# Patient Record
Sex: Male | Born: 1985 | Race: Black or African American | Hispanic: No | Marital: Single | Smoking: Current every day smoker
Health system: Southern US, Community
[De-identification: ages and names within clinical notes are randomized; demographics above are authoritative.]

## PROBLEM LIST (undated history)

## (undated) DIAGNOSIS — N200 Calculus of kidney: Secondary | ICD-10-CM

---

## 2005-08-21 ENCOUNTER — Emergency Department: Payer: Self-pay | Admitting: Unknown Physician Specialty

## 2007-08-20 ENCOUNTER — Emergency Department: Payer: Self-pay | Admitting: Emergency Medicine

## 2010-07-29 ENCOUNTER — Emergency Department: Payer: Self-pay | Admitting: Emergency Medicine

## 2014-11-10 ENCOUNTER — Emergency Department: Payer: Self-pay | Admitting: Emergency Medicine

## 2014-11-10 LAB — URINALYSIS, COMPLETE
BACTERIA: NONE SEEN
Bilirubin,UR: NEGATIVE
Glucose,UR: NEGATIVE mg/dL (ref 0–75)
Ketone: NEGATIVE
Nitrite: NEGATIVE
PH: 5 (ref 4.5–8.0)
Protein: 30
Specific Gravity: 1.021 (ref 1.003–1.030)
Squamous Epithelial: NONE SEEN
WBC UR: 41 /HPF (ref 0–5)

## 2014-11-10 LAB — CBC WITH DIFFERENTIAL/PLATELET
BASOS PCT: 1.2 %
Basophil #: 0.1 10*3/uL (ref 0.0–0.1)
Eosinophil #: 0.2 10*3/uL (ref 0.0–0.7)
Eosinophil %: 1.8 %
HCT: 46.1 % (ref 40.0–52.0)
HGB: 14.8 g/dL (ref 13.0–18.0)
LYMPHS PCT: 47.7 %
Lymphocyte #: 5.1 10*3/uL — ABNORMAL HIGH (ref 1.0–3.6)
MCH: 30.7 pg (ref 26.0–34.0)
MCHC: 32.1 g/dL (ref 32.0–36.0)
MCV: 96 fL (ref 80–100)
Monocyte #: 1.1 x10 3/mm — ABNORMAL HIGH (ref 0.2–1.0)
Monocyte %: 10.6 %
Neutrophil #: 4.1 10*3/uL (ref 1.4–6.5)
Neutrophil %: 38.7 %
PLATELETS: 257 10*3/uL (ref 150–440)
RBC: 4.81 10*6/uL (ref 4.40–5.90)
RDW: 14.6 % — AB (ref 11.5–14.5)
WBC: 10.6 10*3/uL (ref 3.8–10.6)

## 2014-11-10 LAB — COMPREHENSIVE METABOLIC PANEL
ALBUMIN: 4.3 g/dL (ref 3.4–5.0)
ALK PHOS: 86 U/L
ALT: 24 U/L
Anion Gap: 4 — ABNORMAL LOW (ref 7–16)
BUN: 7 mg/dL (ref 7–18)
Bilirubin,Total: 0.5 mg/dL (ref 0.2–1.0)
CHLORIDE: 107 mmol/L (ref 98–107)
CREATININE: 0.97 mg/dL (ref 0.60–1.30)
Calcium, Total: 9.6 mg/dL (ref 8.5–10.1)
Co2: 26 mmol/L (ref 21–32)
EGFR (Non-African Amer.): >60
Glucose: 83 mg/dL (ref 65–99)
Osmolality: 271 (ref 275–301)
Potassium: 4.6 mmol/L (ref 3.5–5.1)
SGOT(AST): 25 U/L (ref 15–37)
Sodium: 137 mmol/L (ref 136–145)
TOTAL PROTEIN: 8.4 g/dL — AB (ref 6.4–8.2)

## 2014-11-10 LAB — LIPASE, BLOOD: LIPASE: 85 U/L (ref 73–393)

## 2014-11-10 LAB — GC/CHLAMYDIA PROBE AMP

## 2014-11-27 LAB — URINE CULTURE

## 2015-05-05 ENCOUNTER — Emergency Department
Admission: EM | Admit: 2015-05-05 | Discharge: 2015-05-05 | Disposition: A | Discharge status: Home / Self Care | Payer: Self-pay | Attending: Emergency Medicine | Admitting: Emergency Medicine

## 2015-05-05 DIAGNOSIS — K047 Periapical abscess without sinus: Secondary | ICD-10-CM

## 2015-05-05 DIAGNOSIS — K0381 Cracked tooth: Secondary | ICD-10-CM | POA: Insufficient documentation

## 2015-05-05 LAB — CBC WITH DIFFERENTIAL/PLATELET
BASOS PCT: 1 %
Basophils Absolute: 0.1 10*3/uL (ref 0–0.1)
Eosinophils Absolute: 0.1 10*3/uL (ref 0–0.7)
Eosinophils Relative: 1 %
HCT: 44.3 % (ref 40.0–52.0)
Hemoglobin: 14.6 g/dL (ref 13.0–18.0)
Lymphocytes Relative: 32 %
Lymphs Abs: 4.6 10*3/uL — ABNORMAL HIGH (ref 1.0–3.6)
MCH: 30.6 pg (ref 26.0–34.0)
MCHC: 33 g/dL (ref 32.0–36.0)
MCV: 92.6 fL (ref 80.0–100.0)
MONOS PCT: 9 %
Monocytes Absolute: 1.4 10*3/uL — ABNORMAL HIGH (ref 0.2–1.0)
NEUTROS ABS: 8.3 10*3/uL — AB (ref 1.4–6.5)
NEUTROS PCT: 57 %
Platelets: 215 10*3/uL (ref 150–440)
RBC: 4.79 MIL/uL (ref 4.40–5.90)
RDW: 14.3 % (ref 11.5–14.5)
WBC: 14.5 10*3/uL — ABNORMAL HIGH (ref 3.8–10.6)

## 2015-05-05 LAB — COMPREHENSIVE METABOLIC PANEL
ALBUMIN: 4.9 g/dL (ref 3.5–5.0)
ALK PHOS: 80 U/L (ref 38–126)
ALT: 12 U/L — ABNORMAL LOW (ref 17–63)
ANION GAP: 8 (ref 5–15)
AST: 18 U/L (ref 15–41)
BUN: 11 mg/dL (ref 6–20)
CHLORIDE: 107 mmol/L (ref 101–111)
CO2: 25 mmol/L (ref 22–32)
Calcium: 9.6 mg/dL (ref 8.9–10.3)
Creatinine, Ser: 0.99 mg/dL (ref 0.61–1.24)
GFR calc non Af Amer: >60 mL/min (ref >60)
Glucose, Bld: 84 mg/dL (ref 65–99)
Potassium: 4 mmol/L (ref 3.5–5.1)
Sodium: 140 mmol/L (ref 135–145)
TOTAL PROTEIN: 8.6 g/dL — AB (ref 6.5–8.1)
Total Bilirubin: 0.5 mg/dL (ref 0.3–1.2)

## 2015-05-05 MED ORDER — SODIUM CHLORIDE 0.9 % IV SOLN
1.5000 g | INTRAVENOUS | Status: AC
  Administered 2015-05-05: 1.5 g via INTRAVENOUS
  Filled 2015-05-05: qty 1.5

## 2015-05-05 MED ORDER — HYDROCODONE-ACETAMINOPHEN 5-325 MG PO TABS: 1.0000 | ORAL_TABLET | ORAL | Status: DC | PRN

## 2015-05-05 MED ORDER — AMOXICILLIN-POT CLAVULANATE 875-125 MG PO TABS: 1.0000 | ORAL_TABLET | Freq: Two times a day (BID) | ORAL | Status: DC

## 2015-05-05 MED ORDER — MORPHINE SULFATE 4 MG/ML IJ SOLN
INTRAMUSCULAR | Status: AC
  Administered 2015-05-05: 4 mg via INTRAVENOUS
  Filled 2015-05-05: qty 1

## 2015-05-05 MED ORDER — MORPHINE SULFATE 4 MG/ML IJ SOLN
4.0000 mg | Freq: Once | INTRAMUSCULAR | Status: AC
  Administered 2015-05-05: 4 mg via INTRAVENOUS

## 2015-05-05 NOTE — ED Notes (Signed)
Pt states awoke this am with right lower jaw swelling. Pt with marked right lower jaw swelling. Pt denies known fever. Denies chills. Pt states took tylenol at home and has had ice on "it all day".

## 2015-05-05 NOTE — Discharge Instructions (Signed)
Dental Abscess °A dental abscess is a collection of infected fluid (pus) from a bacterial infection in the inner part of the tooth (pulp). It usually occurs at the end of the tooth's root.  °CAUSES  °· Severe tooth decay. °· Trauma to the tooth that allows bacteria to enter into the pulp, such as a broken or chipped tooth. °SYMPTOMS  °· Severe pain in and around the infected tooth. °· Swelling and redness around the abscessed tooth or in the mouth or face. °· Tenderness. °· Pus drainage. °· Bad breath. °· Bitter taste in the mouth. °· Difficulty swallowing. °· Difficulty opening the mouth. °· Nausea. °· Vomiting. °· Chills. °· Swollen neck glands. °DIAGNOSIS  °· A medical and dental history will be taken. °· An examination will be performed by tapping on the abscessed tooth. °· X-rays may be taken of the tooth to identify the abscess. °TREATMENT °The goal of treatment is to eliminate the infection. You may be prescribed antibiotic medicine to stop the infection from spreading. A root canal may be performed to save the tooth. If the tooth cannot be saved, it may be pulled (extracted) and the abscess may be drained.  °HOME CARE INSTRUCTIONS °· Only take over-the-counter or prescription medicines for pain, fever, or discomfort as directed by your caregiver. °· Rinse your mouth (gargle) often with salt water (¼ tsp salt in 8 oz [250 ml] of warm water) to relieve pain or swelling. °· Do not drive after taking pain medicine (narcotics). °· Do not apply heat to the outside of your face. °· Return to your dentist for further treatment as directed. °SEEK MEDICAL CARE IF: °· Your pain is not helped by medicine. °· Your pain is getting worse instead of better. °SEEK IMMEDIATE MEDICAL CARE IF: °· You have a fever or persistent symptoms for more than 2-3 days. °· You have a fever and your symptoms suddenly get worse. °· You have chills or a very bad headache. °· You have problems breathing or swallowing. °· You have trouble  opening your mouth. °· You have swelling in the neck or around the eye. °Document Released: 10/21/2005 Document Revised: 07/15/2012 Document Reviewed: 01/29/2011 °ExitCare® Patient Information ©2015 [employer]. This information is not intended to replace advice given to you by your health care provider. Make sure you discuss any questions you have with your health care provider. ° ° °OPTIONS FOR DENTAL FOLLOW UP CARE ° °[place] Department of Health and Human Services - Local Safety Net Dental Clinics °http://www.ncdhhs.gov/dph/oralhealth/services/safetynetclinics.htm °  °[facility] (336-562-3123) ° °[facility] [place] (919-933-9087) ° °[facility] (919-663-1744 ext 237) ° °[facility] (336-570-6415) ° °[facility] (919-968-2025) °This clinic caters to the indigent population and is on a lottery system. °Location: °[facility], [address], [place] °Clinic Hours: °Wednesdays from 6pm - 9pm, patients seen by a lottery system. °For dates, call or go to www.med.[facility].edu/[facility]/patients/Dental-[facility] °Services: °Cleanings, fillings and simple extractions. °Payment Options: °DENTAL WORK IS FREE OF CHARGE. Bring proof of income or support. °Best way to get seen: °Arrive at 5:15 pm - this is a lottery, NOT first come/first serve, so arriving earlier will not increase your chances of being seen. °  °  °[facility] °919-537-3737 °Select option 1 for emergencies °  °Location: °[facility], [address], [place] °Clinic Hours: °No walk-ins accepted - call the day before to schedule an appointment. °Check in times   are 9:30 am and 1:30 pm. °Services: °Simple extractions, temporary fillings, pulpectomy/pulp debridement, uncomplicated abscess drainage. °Payment Options: °PAYMENT IS DUE AT THE TIME OF SERVICE.  Fee is usually $100-200, additional surgical procedures (e.g. abscess drainage) may  be extra. °Cash, checks, Visa/MasterCard accepted.  Can file Medicaid if patient is covered for dental - patient should call case worker to check. °No discount for [facility] patients. °Best way to get seen: °MUST call the day before and get onto the schedule. Can usually be seen the next 1-2 days. No walk-ins accepted. °  °  °[facility] °919-933-9087 °  °Location: °[facility], [address], [place] °Clinic Hours: °M, W, Th, F 8am or 1:30pm, Tues 9a or 1:30 - first come/first served. °Services: °Simple extractions, temporary fillings, uncomplicated abscess drainage.  You do not need to be an [place] resident. °Payment Options: °PAYMENT IS DUE AT THE TIME OF SERVICE. °Dental insurance, otherwise sliding scale - bring proof of income or support. °Depending on income and treatment needed, cost is usually $50-200. °Best way to get seen: °Arrive early as it is first come/first served. °  °  °[facility] °919-542-1641 °  °Location: °[address] °Clinic Hours: °Mon-Thu 8a-5p °Services: °Most basic dental services including extractions and fillings. °Payment Options: °PAYMENT IS DUE AT THE TIME OF SERVICE. °Sliding scale, up to 50% off - bring proof if income or support. °Medicaid with dental option accepted. °Best way to get seen: °Call to schedule an appointment, can usually be seen within 2 weeks OR they will try to see walk-ins - show up at 8a or 2p (you may have to wait). °  °  °[facility] °919-245-2435 °[place] RESIDENTS ONLY °  °Location: °[facility], [address], [place], [place] [address] °Clinic Hours: By appointment only. °Monday - Thursday 8am-5pm, Friday 8am-12pm °Services: Cleanings, fillings, extractions. °Payment Options: °PAYMENT IS DUE AT THE TIME OF SERVICE. °Cash, Visa or MasterCard. Sliding scale - $30 minimum per service. °Best way to get seen: °Come in to  office, complete packet and make an appointment - need proof of income °or support monies for each household member and proof of [place] residence. °Usually takes about a month to get in. °  °  °[facility] °[phone] °  °Location: °[address] [place] °Clinic Hours: Walk-in Urgent Care Dental Services are offered Monday-Friday mornings only. °The numbers of emergencies accepted daily is limited to the number of °providers available. °Maximum 15 - Mondays, Wednesdays & Thursdays °Maximum 10 - Tuesdays & Fridays °Services: °You do not need to be a [place] resident to be seen for a dental emergency. °Emergencies are defined as pain, swelling, abnormal bleeding, or dental trauma. Walkins will receive x-rays if needed. °NOTE: Dental cleaning is not an emergency. °Payment Options: °PAYMENT IS DUE AT THE TIME OF SERVICE. °Minimum co-pay is $40.00 for uninsured patients. °Minimum co-pay is $3.00 for Medicaid with dental coverage. °Dental Insurance is accepted and must be presented at time of visit. °Medicare does not cover dental. °Forms of payment: Cash, credit card, checks. °Best way to get seen: °If not previously registered with the clinic, walk-in dental registration begins at 7:15 am and is on a first come/first serve basis. °If previously registered with the clinic, call to make an appointment. °  °  °[facility] °919-776-4359 °[place] RESIDENTS ONLY °  °Location: °[address],   [place], [place] °Clinic Hours: °Mon-Thu 10a-2p °Services: Extractions only! °Payment Options: °FREE (donations accepted) - bring proof of income or support °Best way to get seen: °Call and schedule an appointment OR come at 8am on the 1st Monday of every month (except for holidays) when it is first come/first served. °  °  °[facility] °[phone] °  °Location: °[address], [place] °Clinic Hours: °Friday mornings °Services, Payment Options, Best way to get  seen: °Call for info °

## 2015-05-05 NOTE — ED Provider Notes (Signed)
[facility] Emergency Department Provider Note ____________________________________________  Time seen: Approximately 10:45 PM  I have reviewed the triage vital signs and the nursing notes.   HISTORY  Chief Complaint Facial Swelling  HPI Willie Morton is a 29 y.o. male who presents to the emergency department for facial swelling and dental pain. He states that the swelling began last night and has worsened throughout the day. He states that the pain has become very intense. He reports that he has had similar symptoms in the same area but did not do anything to treat it. Pain is a 10/10.   No past medical history on file.  There are no active problems to display for this patient.   No past surgical history on file.  Current Outpatient Rx  Name  Route  Sig  Dispense  Refill  . amoxicillin-clavulanate (AUGMENTIN) 875-125 MG per tablet   Oral   Take 1 tablet by mouth 2 (two) times daily.   20 tablet   0   . HYDROcodone-acetaminophen (NORCO/VICODIN) 5-325 MG per tablet   Oral   Take 1 tablet by mouth every 4 (four) hours as needed for moderate pain.   12 tablet   0     Allergies Review of patient's allergies indicates no known allergies.  No family history on file.  Social History History  Substance Use Topics  . Smoking status: Not on file  . Smokeless tobacco: Not on file  . Alcohol Use: Not on file    Review of Systems Constitutional: No fever/chills Eyes: No visual changes. ENT: No sore throat. Cardiovascular: Denies chest pain. Respiratory: Denies shortness of breath. Gastrointestinal: No abdominal pain.  No nausea, no vomiting.  Genitourinary: Negative for dysuria. Musculoskeletal: Negative for back pain. Skin: Negative for rash. Neurological: Negative for headaches, focal weakness or numbness. 10-point ROS otherwise negative.  ____________________________________________   PHYSICAL EXAM:  VITAL SIGNS: ED Triage Vitals    Enc Vitals Group     BP 05/05/15 1949 125/76 mmHg     Pulse Rate 05/05/15 1949 70     Resp 05/05/15 1949 14     Temp 05/05/15 1949 99.3 F (37.4 C)     Temp Source 05/05/15 1949 Oral     SpO2 05/05/15 1949 100 %     Weight 05/05/15 1949 145 lb (65.772 kg)     Height 05/05/15 1949  (1.803 m)     Head Cir --      Peak Flow --      Pain Score 05/05/15 1950 9     Pain Loc --      Pain Edu? --      Excl. in GC? --     Constitutional: Alert and oriented. Well appearing and in no acute distress. Eyes: Conjunctivae are normal. PERRL. EOMI. Head: Atraumatic. Nose: No congestion/rhinnorhea. Mouth/Throat: Mucous membranes are moist.  Oropharynx non-erythematous. No induration in sublingual area. Periodontal Exam    Neck: No stridor. No tenderness. Mandibular edema does not extend into the neck.  Hematological/Lymphatic/Immunilogical: No cervical lymphadenopathy. Cardiovascular:   Good peripheral circulation. Respiratory: Normal respiratory effort.  No retractions. Musculoskeletal: No lower extremity tenderness nor edema.  No joint effusions. Neurologic:  Normal speech and language. No gross focal neurologic deficits are appreciated. Speech is normal. No gait instability. Skin:  Skin is warm, dry and intact. No rash noted. Psychiatric: Mood and affect are normal. Speech and behavior are normal.  ____________________________________________   LABS (all labs ordered are listed, but  only abnormal results are displayed)  Labs Reviewed  CBC WITH DIFFERENTIAL/PLATELET - Abnormal; Notable for the following:    WBC 14.5 (*)    Neutro Abs 8.3 (*)    Lymphs Abs 4.6 (*)    Monocytes Absolute 1.4 (*)    All other components within normal limits  COMPREHENSIVE METABOLIC PANEL - Abnormal; Notable for the following:    Total Protein 8.6 (*)    ALT 12 (*)    All other components within normal limits   ____________________________________________   RADIOLOGY  Not  indicated ____________________________________________   PROCEDURES  Procedure(s) performed: None  Critical Care performed: No  ____________________________________________   INITIAL IMPRESSION / ASSESSMENT AND PLAN / ED COURSE  Pertinent labs & imaging results that were available during my care of the patient were reviewed by me and considered in my medical decision making (see chart for details).  IV Unasyn was given in the ER. Patient will receive a Rx. For Augmentin and Norco.   Patient was advised to see the dentist within 14 days. Also advised to take the antibiotic until finished. Instructed to return to the ER for symptoms that change or worsen if you are unable to schedule an appointment. ____________________________________________   FINAL CLINICAL IMPRESSION(S) / ED DIAGNOSES  Final diagnoses:  Dental abscess      Chinita PesterCari B Xenia Nile, FNP 05/05/15 [id number]  Loleta Roseory Forbach, MD 05/06/15 [id number]

## 2016-10-02 ENCOUNTER — Encounter: Payer: Self-pay | Admitting: *Deleted

## 2016-10-02 ENCOUNTER — Emergency Department
Admission: EM | Admit: 2016-10-02 | Discharge: 2016-10-02 | Disposition: A | Discharge status: Home / Self Care | Payer: Managed Care, Other (non HMO) | Attending: Emergency Medicine | Admitting: Emergency Medicine

## 2016-10-02 DIAGNOSIS — R300 Dysuria: Secondary | ICD-10-CM | POA: Diagnosis present

## 2016-10-02 DIAGNOSIS — A549 Gonococcal infection, unspecified: Secondary | ICD-10-CM | POA: Insufficient documentation

## 2016-10-02 DIAGNOSIS — F17200 Nicotine dependence, unspecified, uncomplicated: Secondary | ICD-10-CM | POA: Insufficient documentation

## 2016-10-02 HISTORY — DX: Calculus of kidney: N20.0

## 2016-10-02 LAB — URINALYSIS COMPLETE WITH MICROSCOPIC (ARMC ONLY)
BACTERIA UA: NONE SEEN
Bilirubin Urine: NEGATIVE
Glucose, UA: NEGATIVE mg/dL
KETONES UR: NEGATIVE mg/dL
NITRITE: NEGATIVE
PH: 6 (ref 5.0–8.0)
PROTEIN: 100 mg/dL — AB
SQUAMOUS EPITHELIAL / LPF: NONE SEEN
Specific Gravity, Urine: 1.027 (ref 1.005–1.030)

## 2016-10-02 LAB — CHLAMYDIA/NGC RT PCR (ARMC ONLY)
Chlamydia Tr: NOT DETECTED
N gonorrhoeae: DETECTED — AB

## 2016-10-02 MED ORDER — CEFTRIAXONE SODIUM 250 MG IJ SOLR
250.0000 mg | Freq: Once | INTRAMUSCULAR | Status: AC
  Administered 2016-10-02: 250 mg via INTRAMUSCULAR
  Filled 2016-10-02: qty 250

## 2016-10-02 MED ORDER — DEXTROSE 5 % IV SOLN: 250.0000 mg | Freq: Once | INTRAVENOUS | Status: DC

## 2016-10-02 MED ORDER — AZITHROMYCIN 500 MG PO TABS
1000.0000 mg | ORAL_TABLET | Freq: Once | ORAL | Status: AC
  Administered 2016-10-02: 1000 mg via ORAL
  Filled 2016-10-02: qty 2

## 2016-10-02 NOTE — ED Notes (Signed)
Pt found walking in hallway, stating he is hungry and needs to go. Pt directed to room. Will talk to Dr. Shaune PollackLord about getting food and DC papers.

## 2016-10-02 NOTE — ED Notes (Signed)
Pt stated he had to leave to go pick up daughter, Dr. Shaune PollackLord gave pt DC papers and discussed them with pt. Dr. Shaune PollackLord states pt verbalized understanding of DC papers. Ambulatory upon DC

## 2016-10-02 NOTE — ED Triage Notes (Signed)
States difficulty urinating with dark urine and some blood, states hx of kidney stones, pt awake and alert in no acute distress

## 2016-10-02 NOTE — ED Provider Notes (Signed)
[facility] Emergency Department Provider Note ____________________________________________   I have reviewed the triage vital signs and the triage nursing note.  HISTORY  Chief Complaint Dysuria   Historian Patient  HPI Willie Morton is a 30 y.o. male presents with several days of pelvic pressure and burning with urination. He states that he's had bloody and dark urine. He reports history of kidney stones, but this does not feel similar to prior kidney stones. Denies fever. He states that he was potentially exposed to STD through sexual contact. No nausea or vomiting. No diarrhea. Discomfort is mild and worse when he tries to urinate.    Past Medical History:  Diagnosis Date  . Kidney stone     There are no active problems to display for this patient.   History reviewed. No pertinent surgical history.  Prior to Admission medications   Medication Sig Start Date End Date Taking? Authorizing Provider  acetaminophen (TYLENOL) 500 MG chewable tablet Chew 500 mg by mouth every 4 (four) hours as needed for pain.   Yes Historical Provider, MD    No Known Allergies  History reviewed. No pertinent family history.  Social History Social History  Substance Use Topics  . Smoking status: Current Every Day Smoker  . Smokeless tobacco: Never Used  . Alcohol use Yes    Review of Systems  Constitutional: Negative for fever. Eyes: Negative for visual changes. ENT: Negative for sore throat. Cardiovascular: Negative for chest pain. Respiratory: Negative for shortness of breath. Gastrointestinal: Negative for Vomiting or diarrhea. Genitourinary: Positive as per history of present illness Musculoskeletal: Negative for back pain. Skin: Negative for rash. Neurological: Negative for headache. 10 point Review of Systems otherwise negative ____________________________________________   PHYSICAL EXAM:  VITAL SIGNS: ED Triage Vitals [10/02/16 0952]  Enc  Vitals Group     BP 135/78     Pulse Rate 99     Resp 18     Temp 99.1 F (37.3 C)     Temp Source Oral     SpO2 99 %     Weight 144 lb (65.3 kg)     Height 5\' 11"  (1.803 m)     Head Circumference      Peak Flow      Pain Score 10     Pain Loc      Pain Edu?      Excl. in GC?      Constitutional: Alert and oriented. Well appearing and in no distress. HEENT   Head: Normocephalic and atraumatic.      Eyes: Conjunctivae are normal. PERRL. Normal extraocular movements.      Ears:         Nose: No congestion/rhinnorhea.   Mouth/Throat: Mucous membranes are moist.   Neck: No stridor. Cardiovascular/Chest: Normal peripheral extremities color Respiratory: Normal respiratory effort without tachypnea nor retractions. Gastrointestinal: Soft. No distention, no guarding, no rebound. Very mild suprapubic discomfort.  No focal rlq pain.  Genitourinary/rectal:Deferred Musculoskeletal: Nontender with normal range of motion in all extremities. No joint effusions.  No lower extremity tenderness.  No edema. Neurologic:  Normal speech and language. No gross or focal neurologic deficits are appreciated. Skin:  Skin is warm, dry and intact. No rash noted. Psychiatric: Mood and affect are normal. Speech and behavior are normal. Patient exhibits appropriate insight and judgment.   ____________________________________________  LABS (pertinent positives/negatives)  Labs Reviewed  CHLAMYDIA/NGC RT PCR ([facility] ONLY) - Abnormal; Notable for the following:  Result Value   N gonorrhoeae DETECTED (*)    All other components within normal limits  URINALYSIS COMPLETEWITH MICROSCOPIC ([facility] ONLY) - Abnormal; Notable for the following:    Color, Urine AMBER (*)    APPearance CLOUDY (*)    Hgb urine dipstick 1+ (*)    Protein, ur 100 (*)    Leukocytes, UA 3+ (*)    All other components within normal limits  URINE CULTURE    ____________________________________________     EKG I, Governor Rooksebecca Hensley Treat, MD, the attending physician have personally viewed and interpreted all ECGs.  None ____________________________________________  RADIOLOGY All Xrays were viewed by me. Imaging interpreted by Radiologist.  None __________________________________________  PROCEDURES  Procedure(s) performed: None  Critical Care performed: None  ____________________________________________   ED COURSE / ASSESSMENT AND PLAN  Pertinent labs & imaging results that were available during my care of the patient were reviewed by me and considered in my medical decision making (see chart for details).   Initially patient's symptoms sounded more potentially concerning for kidney stone with report of hematuria, but then he states it does not feel similar to him, and he stated that he was mostly concerned about possible STD exposure.  Patient was treated with Rocephin 250 mg IM and azithromycin and found to be positive for gonorrhea. We discussed testing or treatment for any sexual partners.  Your culture was sent. Patient declined GU physical exam.   CONSULTATIONS:  None  Patient / Family / Caregiver informed of clinical course, medical decision-making process, and agree with plan.  I discussed return precautions, follow-up instructions, and discharge instructions with patient and/or family.   ___________________________________________   FINAL CLINICAL IMPRESSION(S) / ED DIAGNOSES   Final diagnoses:  Dysuria  Gonorrhea              Note: This dictation was prepared with Dragon dictation. Any transcriptional errors that result from this process are unintentional    Governor Rooksebecca Shontell Prosser, MD 10/02/16 [phone]

## 2016-10-02 NOTE — Discharge Instructions (Signed)
You were evaluated for discomfort with urination, and found to have STD, gonorrhea. You are given treatment here in the emergency department. You do need to tell any sexual contacts so they may be tested and treated as well.  Return to the emergency department for any worsening abdominal pain, fever, skin rash, vomiting, or any other symptoms concerning to you.

## 2016-10-02 NOTE — ED Notes (Signed)
Pt given meal tray and water 

## 2016-10-03 LAB — URINE CULTURE: Culture: NO GROWTH

## 2020-01-30 ENCOUNTER — Emergency Department: Payer: PRIVATE HEALTH INSURANCE

## 2020-01-30 ENCOUNTER — Encounter: Payer: Self-pay | Admitting: Emergency Medicine

## 2020-01-30 ENCOUNTER — Other Ambulatory Visit: Payer: Self-pay

## 2020-01-30 ENCOUNTER — Emergency Department
Admission: EM | Admit: 2020-01-30 | Discharge: 2020-01-30 | Disposition: A | Discharge status: Home / Self Care | Payer: PRIVATE HEALTH INSURANCE | Attending: Emergency Medicine | Admitting: Emergency Medicine

## 2020-01-30 DIAGNOSIS — F1210 Cannabis abuse, uncomplicated: Secondary | ICD-10-CM | POA: Diagnosis not present

## 2020-01-30 DIAGNOSIS — Y92411 Interstate highway as the place of occurrence of the external cause: Secondary | ICD-10-CM | POA: Diagnosis not present

## 2020-01-30 DIAGNOSIS — Y939 Activity, unspecified: Secondary | ICD-10-CM | POA: Insufficient documentation

## 2020-01-30 DIAGNOSIS — M79641 Pain in right hand: Secondary | ICD-10-CM | POA: Insufficient documentation

## 2020-01-30 DIAGNOSIS — F17210 Nicotine dependence, cigarettes, uncomplicated: Secondary | ICD-10-CM | POA: Insufficient documentation

## 2020-01-30 DIAGNOSIS — R0789 Other chest pain: Secondary | ICD-10-CM | POA: Insufficient documentation

## 2020-01-30 DIAGNOSIS — S40812A Abrasion of left upper arm, initial encounter: Secondary | ICD-10-CM | POA: Diagnosis not present

## 2020-01-30 DIAGNOSIS — S59912A Unspecified injury of left forearm, initial encounter: Secondary | ICD-10-CM | POA: Diagnosis present

## 2020-01-30 DIAGNOSIS — Y999 Unspecified external cause status: Secondary | ICD-10-CM | POA: Diagnosis not present

## 2020-01-30 MED ORDER — IBUPROFEN 600 MG PO TABS: 600.0000 mg | ORAL_TABLET | Freq: Four times a day (QID) | ORAL | 0 refills | Status: DC | PRN

## 2020-01-30 MED ORDER — CYCLOBENZAPRINE HCL 5 MG PO TABS: ORAL_TABLET | ORAL | 0 refills | Status: DC
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
## 2020-01-30 NOTE — Discharge Instructions (Signed)
Your x-rays were normal.  Please take ibuprofen for pain and inflammation and Flexeril to help relax your muscles.  Do not drive or work while taking the Flexeril.  Please follow-up with primary care.
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
## 2020-03-16 ENCOUNTER — Ambulatory Visit: Payer: Self-pay | Admitting: Family Medicine

## 2020-03-16 ENCOUNTER — Encounter: Payer: Self-pay | Admitting: Family Medicine

## 2020-03-16 ENCOUNTER — Other Ambulatory Visit: Payer: Self-pay

## 2020-03-16 DIAGNOSIS — Z113 Encounter for screening for infections with a predominantly sexual mode of transmission: Secondary | ICD-10-CM

## 2020-03-16 DIAGNOSIS — Z720 Tobacco use: Secondary | ICD-10-CM

## 2020-03-16 DIAGNOSIS — N5089 Other specified disorders of the male genital organs: Secondary | ICD-10-CM

## 2020-03-16 LAB — GRAM STAIN

## 2020-03-16 NOTE — Progress Notes (Signed)
[facility] Department STI clinic/screening visit  Subjective:  Willie Morton is a 34 y.o. male being seen today for  Chief Complaint  Patient presents with  . SEXUALLY TRANSMITTED DISEASE     The patient reports they do have symptoms.   Patient has the following medical conditions:  There are no problems to display for this patient.   HPI  Pt reports he is here for STI screening. Noticed discharge/wetness before ejaculating x3 months. Also has had lesion on penis come and go over past 8 months - tingly, itchy, not painful.  Pt last urinated 3.5 hrs ago.  See flowsheet for further details and programmatic requirements.    No components found for: HCV  The following portions of the patient's history were reviewed and updated as appropriate: allergies, current medications, past medical history, past social history, past surgical history and problem list.  Objective:  There were no vitals filed for this visit.   Physical Exam Constitutional:      Appearance: Normal appearance.  HENT:     Head: Normocephalic and atraumatic.     Comments: No nits or hair loss    Mouth/Throat:     Mouth: Mucous membranes are moist.     Pharynx: Oropharynx is clear. No oropharyngeal exudate or posterior oropharyngeal erythema.  Pulmonary:     Effort: Pulmonary effort is normal.  Abdominal:     General: Abdomen is flat.     Palpations: Abdomen is soft. There is no hepatomegaly or mass.     Tenderness: There is no abdominal tenderness.  Genitourinary:    Pubic Area: No rash or pubic lice.      Penis: Circumcised. Lesions present. No discharge.      Testes:        Right: Mass (~2cm mobile, mildly tender mass.) and tenderness (mild) present. Swelling not present.        Left: Mass, tenderness or swelling not present.     Epididymis:     Right: Normal. No tenderness.     Left: Normal. No tenderness.     Rectum: Normal.    Lymphadenopathy:     Head:     Right side of head:  No preauricular or posterior auricular adenopathy.     Left side of head: No preauricular or posterior auricular adenopathy.     Cervical: No cervical adenopathy.     Upper Body:     Right upper body: No supraclavicular or axillary adenopathy.     Left upper body: No supraclavicular or axillary adenopathy.     Lower Body: No right inguinal adenopathy. No left inguinal adenopathy.  Skin:    General: Skin is warm and dry.     Findings: No rash.  Neurological:     Mental Status: He is alert and oriented to person, place, and time.       Assessment and Plan:  Willie Morton is a 34 y.o. male presenting to the [facility] Department for STI screening   1. Screening examination for venereal disease -Pt with symptoms. Screenings today as below. Treat gram stain per standing order. -Patient does meet criteria for HepB, HepC Screening. Declines these screenings. -Regarding patch on penis: suspicious for HPV, also recommended screening for syphilis. Advised if area ever blisters or is painful to RTC for HSV screen. -Counseled on warning s/sx and when to seek care. Recommended condom use with all sex and discussed importance of condom use for STI prevention. - Gram stain - Gonococcus  culture x2- throat and urethra - HIV [facility] - Syphilis Serology, [facility]  2. Tobacco use Encouraged pt in their desire to quit, counseled using 5 A's. Tobacco Quitline info given and offered referral for behavioral health which pt declines.  3. 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    Return if symptoms worsen or fail to improve.  No future appointments.  Kandee Keen, PA-C

## 2020-03-16 NOTE — Progress Notes (Signed)
Gram stain reviewed by provider, no tx today. Provider orders completed. 

## 2020-03-21 LAB — GONOCOCCUS CULTURE

## 2021-02-05 ENCOUNTER — Emergency Department
Admission: EM | Admit: 2021-02-05 | Discharge: 2021-02-05 | Disposition: A | Discharge status: Home / Self Care | Payer: PRIVATE HEALTH INSURANCE | Attending: Emergency Medicine | Admitting: Emergency Medicine

## 2021-02-05 ENCOUNTER — Emergency Department: Payer: PRIVATE HEALTH INSURANCE

## 2021-02-05 ENCOUNTER — Other Ambulatory Visit: Payer: Self-pay

## 2021-02-05 ENCOUNTER — Encounter: Payer: Self-pay | Admitting: Emergency Medicine

## 2021-02-05 DIAGNOSIS — M545 Low back pain, unspecified: Secondary | ICD-10-CM | POA: Diagnosis not present

## 2021-02-05 DIAGNOSIS — F17210 Nicotine dependence, cigarettes, uncomplicated: Secondary | ICD-10-CM | POA: Diagnosis not present

## 2021-02-05 LAB — URINALYSIS, COMPLETE (UACMP) WITH MICROSCOPIC
Bacteria, UA: NONE SEEN
Bilirubin Urine: NEGATIVE
Glucose, UA: NEGATIVE mg/dL
Ketones, ur: NEGATIVE mg/dL
Leukocytes,Ua: NEGATIVE
Nitrite: NEGATIVE
Protein, ur: NEGATIVE mg/dL
Specific Gravity, Urine: 1.021 (ref 1.005–1.030)
Squamous Epithelial / LPF: NONE SEEN (ref 0–5)
WBC, UA: NONE SEEN WBC/hpf (ref 0–5)
pH: 6 (ref 5.0–8.0)

## 2021-02-05 MED ORDER — METHOCARBAMOL 750 MG PO TABS: 750.0000 mg | ORAL_TABLET | Freq: Four times a day (QID) | ORAL | 0 refills | Status: AC | PRN

## 2021-02-05 MED ORDER — ACETAMINOPHEN 325 MG PO TABS
650.0000 mg | ORAL_TABLET | Freq: Once | ORAL | Status: AC
  Administered 2021-02-05: 650 mg via ORAL
  Filled 2021-02-05: qty 2

## 2021-02-05 MED ORDER — METHOCARBAMOL 500 MG PO TABS
750.0000 mg | ORAL_TABLET | Freq: Once | ORAL | Status: AC
  Administered 2021-02-05: 750 mg via ORAL
  Filled 2021-02-05: qty 2

## 2021-02-05 MED ORDER — MELOXICAM 15 MG PO TABS: 15.0000 mg | ORAL_TABLET | Freq: Every day | ORAL | 0 refills | Status: AC

## 2021-02-05 MED ORDER — MELOXICAM 7.5 MG PO TABS
15.0000 mg | ORAL_TABLET | Freq: Once | ORAL | Status: AC
  Administered 2021-02-05: 15 mg via ORAL
  Filled 2021-02-05: qty 2

## 2021-02-05 NOTE — ED Provider Notes (Signed)
[facility] Emergency Department Provider Note  ____________________________________________   Event Date/Time   First MD Initiated Contact with Patient 02/05/21 1307     (approximate)  I have reviewed the triage vital signs and the nursing notes.   HISTORY  Chief Complaint Back Pain  HPI Willie Morton is a 35 y.o. male who presents to the emergency department for evaluation of right-sided low back pain.  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  He reports he has had this pain since the middle of December without any acute event, injury or fall.  He states the pain has been persistent, and feels that it is worsening.  He denies any loss of bowel or bladder control, fevers.  He denies any dysuria, frank hematuria.  He localizes his pain around the lower back, around the SI joint.  He has not tried any alleviating measures prior to arrival.        Past Medical History:  Diagnosis Date  . Kidney stone     There are no problems to display for this patient.   History reviewed. No pertinent surgical history.  Prior to Admission medications   Medication Sig Start Date End Date Taking? Authorizing Provider  meloxicam (MOBIC) 15 MG tablet Take 1 tablet (15 mg total) by mouth daily for 15 days. 02/05/21 02/20/21 Yes Takai Chiaramonte, Ruben Gottron, PA  methocarbamol (ROBAXIN-750) 750 MG tablet Take 1 tablet (750 mg total) by mouth 4 (four) times daily as needed for up to 10 days for muscle spasms. 02/05/21 02/15/21 Yes Lucy Chris, PA    Allergies Hydrocodone  History reviewed. No pertinent family history.  Social History Social History   Tobacco Use  . Smoking status: Current Every Day Smoker    Packs/day: 1.00    Types: Cigarettes  . Smokeless tobacco: Never Used  Vaping Use  . Vaping Use: Never used  Substance Use Topics  . Alcohol use: Yes    Alcohol/week: 10.0 standard drinks    Types: 10 Shots of liquor per week   . Drug use: Not Currently    Types: Marijuana    Review of Systems Constitutional: No fever/chills Eyes: No visual changes. ENT: No sore throat. Cardiovascular: Denies chest pain. Respiratory: Denies shortness of breath. Gastrointestinal: No abdominal pain.  No nausea, no vomiting.  No diarrhea.  No constipation. Genitourinary: Negative for dysuria. Musculoskeletal: + back pain. Skin: Negative for rash. Neurological: Negative for headaches, focal weakness or numbness.  ____________________________________________   PHYSICAL EXAM:  VITAL SIGNS: ED Triage Vitals  Enc Vitals Group     BP 02/05/21 1257 (!) 141/79     Pulse Rate 02/05/21 1257 (!) 58     Resp 02/05/21 1257 20     Temp 02/05/21 1257 97.8 F (36.6 C)     Temp Source 02/05/21 1257 Oral     SpO2 02/05/21 1257 97 %     Weight 02/05/21 1258 145 lb (65.8 kg)     Height 02/05/21 1258 5\' 11"  (1.803 m)     Head Circumference --      Peak Flow --      Pain Score 02/05/21 1258 8     Pain Loc --      Pain Edu? --      Excl. in GC? --    Constitutional: Alert and oriented. Well appearing and in no acute distress. Eyes: Conjunctivae are normal. PERRL. EOMI. Head: Atraumatic. Nose: No congestion/rhinnorhea. Mouth/Throat: Mucous  membranes are moist.  Oropharynx non-erythematous. Neck: No stridor.   Cardiovascular: Normal rate, regular rhythm. Grossly normal heart sounds.  Good peripheral circulation. Respiratory: Normal respiratory effort.  No retractions. Lungs CTAB. Gastrointestinal: Soft and nontender. No distention. No abdominal bruits. No CVA tenderness. Musculoskeletal: There is no tenderness to palpation of the midline of the lumbar spine.  The left side paraspinals are nontender.  The right side of the paraspinals are tender, worse in the SI joint region and upper buttock.  Patient maintains 5/5 strength in the bilateral lower extremities in ankle plantarflexion, dorsiflexion, knee flexion and extension as well as  hip flexion. Neurologic:  Normal speech and language. No gross focal neurologic deficits are appreciated. No gait instability. Skin:  Skin is warm, dry and intact. No rash noted. Psychiatric: Mood and affect are normal. Speech and behavior are normal.  ____________________________________________   LABS (all labs ordered are listed, but only abnormal results are displayed)  Labs Reviewed  URINALYSIS, COMPLETE (UACMP) WITH MICROSCOPIC - Abnormal; Notable for the following components:      Result Value   Color, Urine YELLOW (*)    APPearance CLOUDY (*)    Hgb urine dipstick SMALL (*)    All other components within normal limits    ____________________________________________  RADIOLOGY I, Lucy Chris, personally viewed and evaluated these images (plain radiographs) as part of my medical decision making, as well as reviewing the written report by the radiologist.  ED provider interpretation: No acute lumbar fracture identified, probable right renal calculus identified by radiology report  Official radiology report(s): DG Lumbar Spine 2-3 Views  Result Date: 02/05/2021 CLINICAL DATA:  Low back pain since December which acutely worsened today. EXAM: LUMBAR SPINE - 2-3 VIEW COMPARISON:  None. FINDINGS: Vertebral body heights are maintained. No significant disc height loss. Straightening of normal lumbar lordosis likely due to patient positioning or muscle spasm. 7 mm calcific density in the right upper quadrant may be located within the kidney. IMPRESSION: 1. No acute abnormality lumbar spine. 2. Possible 7 mm right renal calculus. Consider correlation with ultrasound. Electronically Signed   By: Acquanetta Belling M.D.   On: 02/05/2021 15:14    ____________________________________________   INITIAL IMPRESSION / ASSESSMENT AND PLAN / ED COURSE  As part of my medical decision making, I reviewed the following data within the electronic MEDICAL RECORD NUMBER Nursing notes reviewed and  incorporated, Radiograph reviewed and Notes from prior ED visits        Patient is a 35 year old male who presents to the emergency department for evaluation of low back pain that has been persistent and worsening since mid December.  See HPI for further details.  In triage, patient has grossly normal vital signs.  On physical exam, the patient does have right lower back tenderness, but is neurovascularly intact with 5/5 strength in the bilateral lower extremities.  He does not have any CVA tenderness present at this time and denies dysuria.  X-rays were obtained and demonstrate no acute fracture lumbar spine, however there was concern for right renal calculus.  Advised the patient that we could obtain a urinalysis to evaluate for possible hemoglobin to see if this symptom could be from active stone as well as possible infection related to his stone.  Patient is eager to go, initially agrees to wait for urinalysis.  While urinalysis pending in the lab, patient states that he is no longer able to wait.  Discussed could initiate anti-inflammatory and muscle relaxant for musculoskeletal related pain,  however encouraged the patient to wait.  He is no longer able to do so, and agreed to sign out AMA.  He understands the risks of an untreated infected stone.  After the patient left, I did notice that his urine did demonstrate hemoglobin, however no evidence of bacteria, leukocytes or nitrites.  I did call him and notify him, and again reiterated that the pain could be related to a stone.  He states that he will try the trial of anti-inflammatory muscle relaxant first, but will return if pain persist.      ____________________________________________   FINAL CLINICAL IMPRESSION(S) / ED DIAGNOSES  Final diagnoses:  Acute right-sided low back pain without sciatica     ED Discharge Orders         Ordered    methocarbamol (ROBAXIN-750) 750 MG tablet  4 times daily PRN        02/05/21 1634     meloxicam (MOBIC) 15 MG tablet  Daily        02/05/21 1634          *Please note:  Willie Morton was evaluated in Emergency Department on 02/05/2021 for the symptoms described in the history of present illness. He was evaluated in the context of the global COVID-19 pandemic, which necessitated consideration that the patient might be at risk for infection with the SARS-CoV-2 virus that causes COVID-19. Institutional protocols and algorithms that pertain to the evaluation of patients at risk for COVID-19 are in a state of rapid change based on information released by regulatory bodies including the CDC and federal and state organizations. These policies and algorithms were followed during the patient's care in the ED.  Some ED evaluations and interventions may be delayed as a result of limited staffing during and the pandemic.*   Note:  This document was prepared using Dragon voice recognition software and may include unintentional dictation errors.   Lucy Chris, PA 02/05/21 1956    Jene Every, MD 02/06/21 [phone]

## 2021-02-05 NOTE — ED Triage Notes (Signed)
Pt to ED via POV with c/o lower back pain since December, pt states consistent low back pain since then. Pt ambulatory without difficulty to triage at this time.

## 2021-02-05 NOTE — ED Notes (Addendum)
See triage note  Presents with lower back pain  States he has had back pain for several months  Pain is non radiating  Ambulates well to treatment room

## 2021-02-05 NOTE — ED Notes (Signed)
Pt is signing AMA

## 2022-10-27 IMAGING — CR DG LUMBAR SPINE 2-3V
3 series · 3 of 3 positions shown · non-contrast
Comparison: None.

CLINICAL DATA: Low back pain since [REDACTED] which acutely worsened
today.

EXAM:
LUMBAR SPINE - 2-3 VIEW

[l-spine ap]
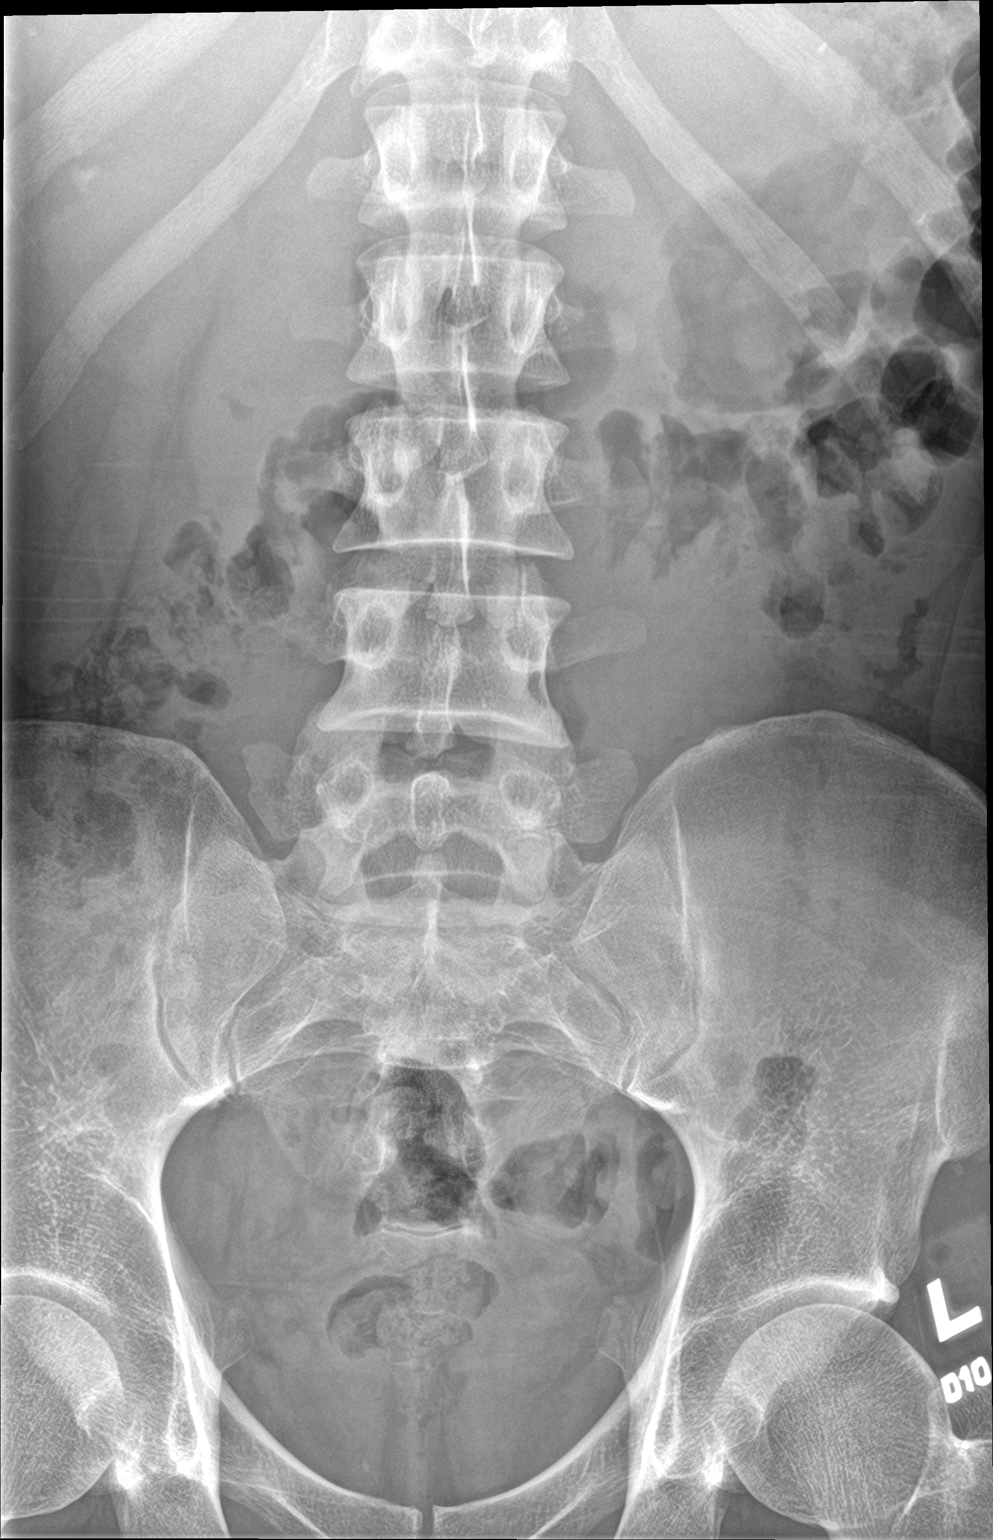

[l-spine lat]
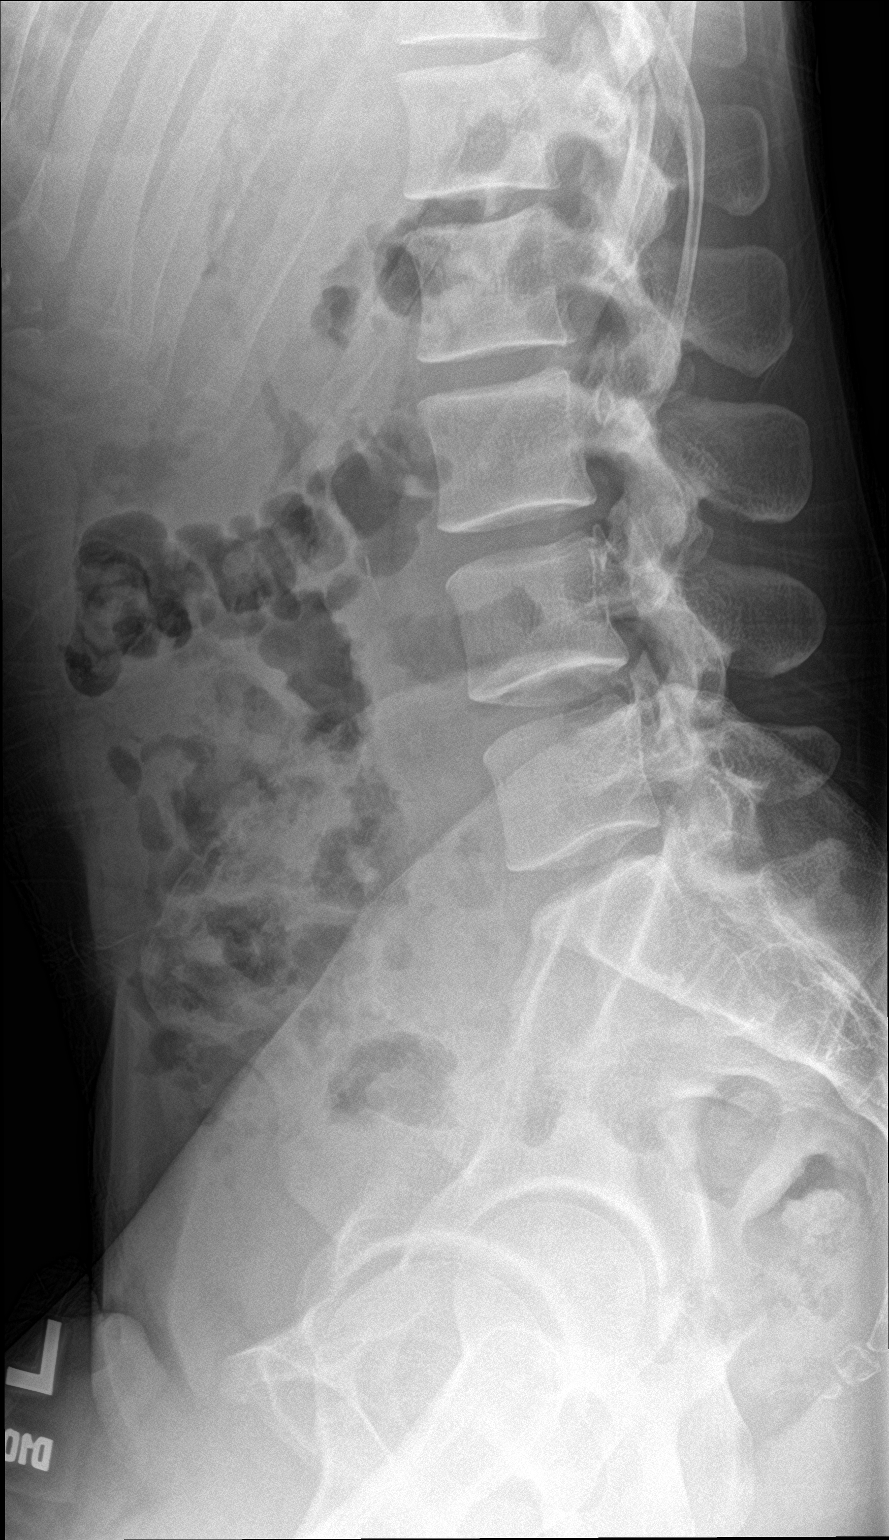

[l-spine spot]
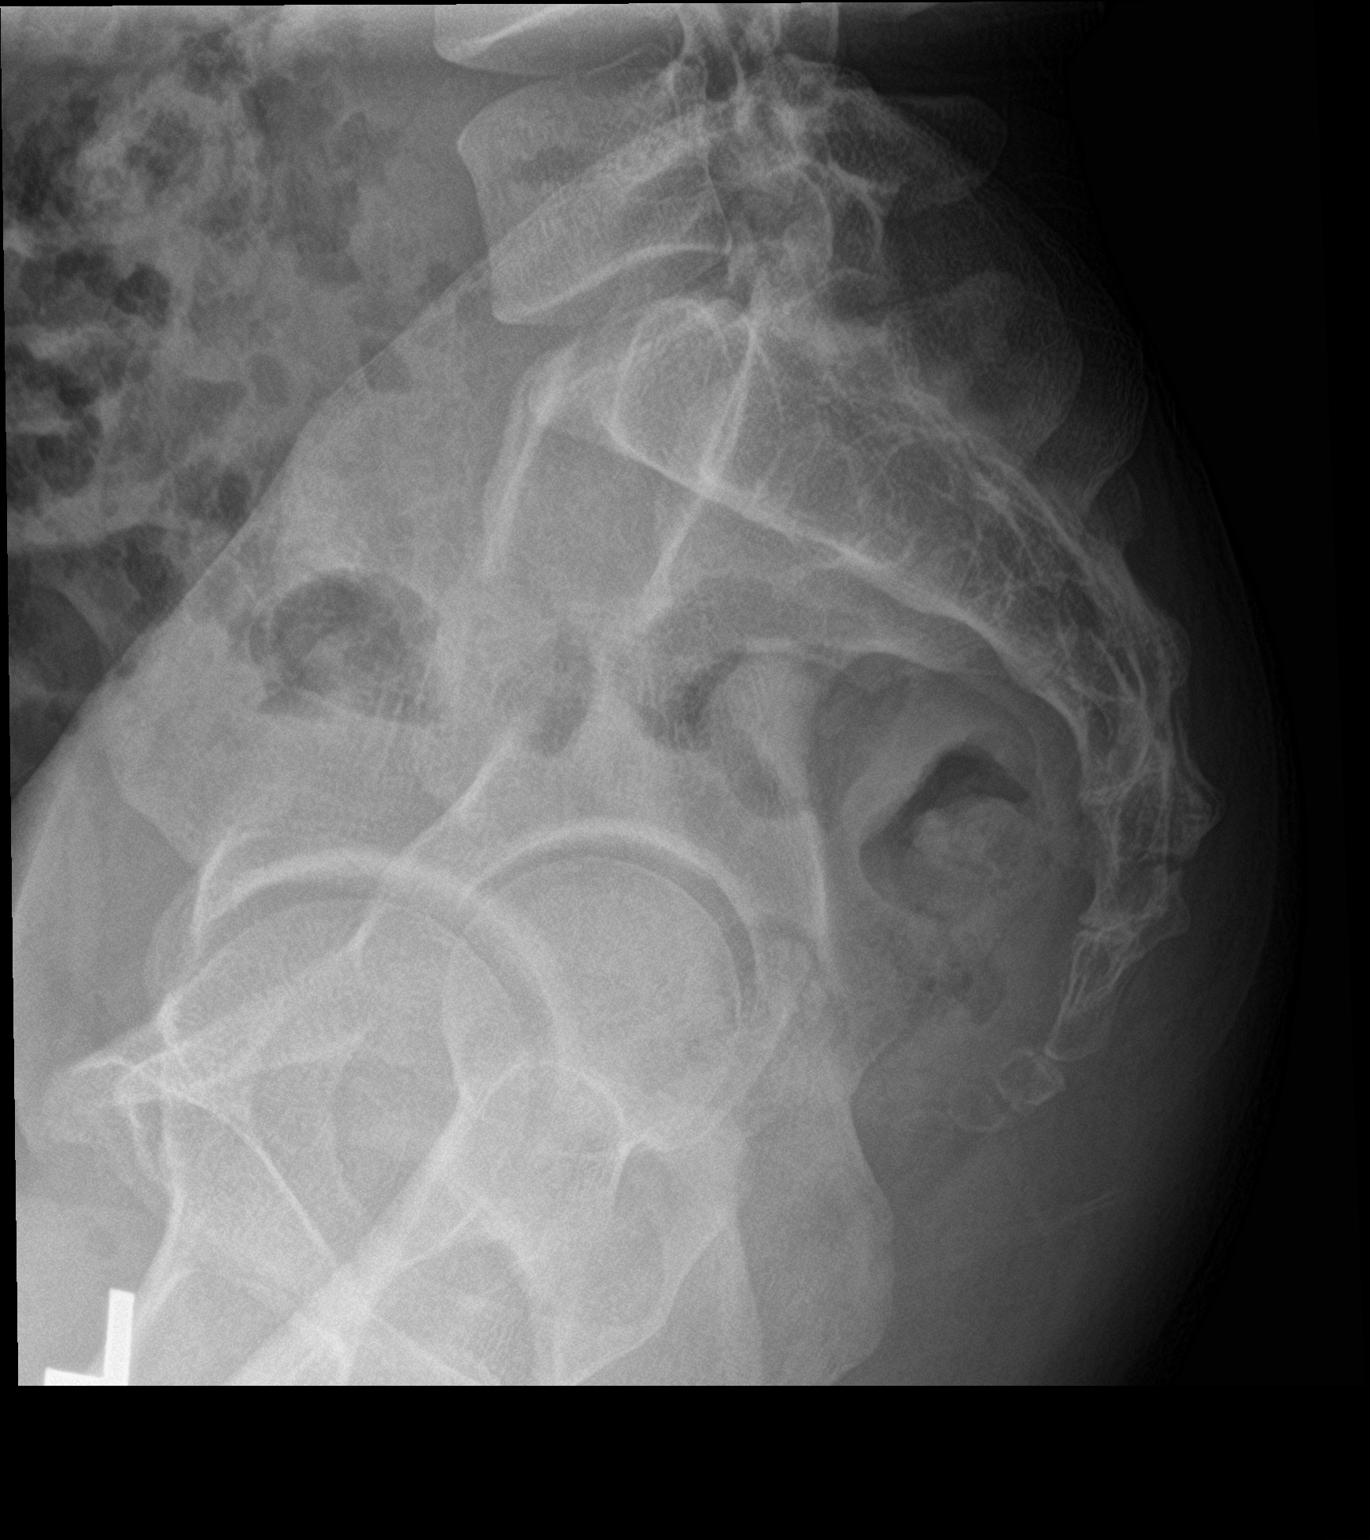

[3 of 3 positions shown; findings below may reference images not displayed]

FINDINGS: Vertebral body heights are maintained. No significant disc height
loss. Straightening of normal lumbar lordosis likely due to patient
positioning or muscle spasm.

7 mm calcific density in the right upper quadrant may be located
within the kidney.
IMPRESSION: 1. No acute abnormality lumbar spine.
2. Possible 7 mm right renal calculus. Consider correlation with
ultrasound.

## 2023-04-01 ENCOUNTER — Emergency Department: Payer: No Typology Code available for payment source

## 2023-04-01 ENCOUNTER — Other Ambulatory Visit: Payer: Self-pay

## 2023-04-01 ENCOUNTER — Encounter: Payer: Self-pay | Admitting: Emergency Medicine

## 2023-04-01 ENCOUNTER — Emergency Department
Admission: EM | Admit: 2023-04-01 | Discharge: 2023-04-01 | Disposition: A | Discharge status: Home / Self Care | Payer: No Typology Code available for payment source | Attending: Emergency Medicine | Admitting: Emergency Medicine

## 2023-04-01 DIAGNOSIS — Y92410 Unspecified street and highway as the place of occurrence of the external cause: Secondary | ICD-10-CM | POA: Diagnosis not present

## 2023-04-01 DIAGNOSIS — S39012A Strain of muscle, fascia and tendon of lower back, initial encounter: Secondary | ICD-10-CM | POA: Insufficient documentation

## 2023-04-01 DIAGNOSIS — S161XXA Strain of muscle, fascia and tendon at neck level, initial encounter: Secondary | ICD-10-CM | POA: Diagnosis not present

## 2023-04-01 DIAGNOSIS — S6991XA Unspecified injury of right wrist, hand and finger(s), initial encounter: Secondary | ICD-10-CM | POA: Insufficient documentation

## 2023-04-01 DIAGNOSIS — S59902A Unspecified injury of left elbow, initial encounter: Secondary | ICD-10-CM | POA: Diagnosis not present

## 2023-04-01 DIAGNOSIS — S199XXA Unspecified injury of neck, initial encounter: Secondary | ICD-10-CM | POA: Diagnosis present

## 2023-04-01 MED ORDER — ACETAMINOPHEN 500 MG PO TABS
1000.0000 mg | ORAL_TABLET | Freq: Once | ORAL | Status: AC
  Administered 2023-04-01: 1000 mg via ORAL
  Filled 2023-04-01: qty 2

## 2023-04-01 MED ORDER — IBUPROFEN 600 MG PO TABS
600.0000 mg | ORAL_TABLET | Freq: Once | ORAL | Status: AC
  Administered 2023-04-01: 600 mg via ORAL
  Filled 2023-04-01: qty 1

## 2023-04-01 NOTE — Discharge Instructions (Signed)
Take acetaminophen 650 mg and ibuprofen 400 mg every 6 hours for pain.  Take with food.  See your doctor this week for checkup
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
## 2024-01-13 ENCOUNTER — Other Ambulatory Visit: Payer: Self-pay

## 2024-01-13 ENCOUNTER — Emergency Department
Admission: EM | Admit: 2024-01-13 | Discharge: 2024-01-13 | Disposition: A | Discharge status: Home / Self Care | Payer: Self-pay | Attending: Emergency Medicine | Admitting: Emergency Medicine

## 2024-01-13 DIAGNOSIS — H1032 Unspecified acute conjunctivitis, left eye: Secondary | ICD-10-CM

## 2024-01-13 DIAGNOSIS — H1089 Other conjunctivitis: Secondary | ICD-10-CM | POA: Insufficient documentation

## 2024-01-13 DIAGNOSIS — H0100B Unspecified blepharitis left eye, upper and lower eyelids: Secondary | ICD-10-CM | POA: Insufficient documentation

## 2024-01-13 MED ORDER — GENTAMICIN SULFATE 0.3 % OP SOLN: 1.0000 [drp] | Freq: Three times a day (TID) | OPHTHALMIC | 0 refills | Status: AC

## 2024-01-13 MED ORDER — TETRACAINE HCL 0.5 % OP SOLN
2.0000 [drp] | Freq: Once | OPHTHALMIC | Status: AC
  Administered 2024-01-13: 2 [drp] via OPHTHALMIC
  Filled 2024-01-13: qty 4

## 2024-01-13 MED ORDER — FLUORESCEIN SODIUM 1 MG OP STRP
1.0000 | ORAL_STRIP | Freq: Once | OPHTHALMIC | Status: AC
  Administered 2024-01-13: 1 via OPHTHALMIC
  Filled 2024-01-13: qty 1

## 2024-01-13 MED ORDER — KETOROLAC TROMETHAMINE 0.5 % OP SOLN: 1.0000 [drp] | Freq: Four times a day (QID) | OPHTHALMIC | 0 refills | Status: AC

## 2024-01-13 NOTE — ED Triage Notes (Signed)
 Pt to ED via POV from home. Pt ambulatory. Pt reports Sunday started having left eye pain. Pt reports woke up this morning and it was swollen, red and more painful. Pt reports vision intact but sensitive to light.

## 2024-01-13 NOTE — ED Provider Notes (Signed)
 [facility] Emergency Department Provider Note     Event Date/Time   First MD Initiated Contact with Patient 01/13/24 1342     (approximate)   History   Eye Pain   HPI  Willie Morton is a 38 y.o. male with a noncontributory medical history, presents to the ED reporting some left eye pain and irritation.  Patient reports onset of symptoms on Sunday, 2 days prior to arrival.  He woke this morning noting swelling to the lids and redness to the eye.  She has noted some excessive tearing and drainage from the eye.  He denies any vision change, foreign body sensation, but he is endorsing some light sensitivity.  No nausea, vomiting, or headache reported.  Physical Exam   Triage Vital Signs: ED Triage Vitals [01/13/24 1253]  Encounter Vitals Group     BP (!) 155/102     Systolic BP Percentile      Diastolic BP Percentile      Pulse Rate (!) 103     Resp 20     Temp 98 F (36.7 C)     Temp Source Oral     SpO2 98 %     Weight      Height      Head Circumference      Peak Flow      Pain Score 9     Pain Loc      Pain Education      Exclude from Growth Chart     Most recent vital signs: Vitals:   01/13/24 1253  BP: (!) 155/102  Pulse: (!) 103  Resp: 20  Temp: 98 F (36.7 C)  SpO2: 98%    General Awake, no distress. NAD HEENT NCAT. PERRL. EOMI. left eye with erythema noted to the upper and lower lids bilaterally.  Conjunctiva is injected.  Copious mucoid drainage is appreciated.  No fluorescein dye uptake is noted.  No gross foreign bodies appreciated.  No rhinorrhea. Mucous membranes are moist.  CV:  Good peripheral perfusion.  RESP:  Normal effort.  ABD:  No distention.    ED Results / Procedures / Treatments   Labs (all labs ordered are listed, but only abnormal results are displayed) Labs Reviewed - No data to display   EKG    RADIOLOGY  No results found.   PROCEDURES:  Critical Care performed:  No  Procedures   MEDICATIONS ORDERED IN ED: Medications  tetracaine (PONTOCAINE) 0.5 % ophthalmic solution 2 drop (has no administration in time range)  fluorescein ophthalmic strip 1 strip (has no administration in time range)     IMPRESSION / MDM / ASSESSMENT AND PLAN / ED COURSE  I reviewed the triage vital signs and the nursing notes.                              Differential diagnosis includes, but is not limited to, conjunctivitis, corneal abrasion, corneal foreign body, iritis  Patient's presentation is most consistent with acute, uncomplicated illness.  Patient's diagnosis is consistent with acute bacterial conjunctivitis and blepharitis of the lids on the left.  Patient with reassuring dental workup at this time.  Patient will be discharged home with prescriptions for Acular and Garamycin solution. Patient is to follow up with [facility] as discussed, as needed or otherwise directed. Patient is given ED precautions to return to the ED for any worsening or new symptoms.  FINAL CLINICAL IMPRESSION(S) / ED DIAGNOSES   Final diagnoses:  Acute bacterial conjunctivitis of left eye  Blepharitis of both upper and lower eyelid of left eye, unspecified type     Rx / DC Orders   ED Discharge Orders          Ordered    ketorolac (ACULAR) 0.5 % ophthalmic solution  4 times daily        01/13/24 1457    gentamicin (GARAMYCIN) 0.3 % ophthalmic solution  3 times daily        01/13/24 1457             Note:  This document was prepared using Dragon voice recognition software and may include unintentional dictation errors.    Lissa Hoard, PA-C 01/13/24 1502    Merwyn Katos, MD 01/13/24 [phone]

## 2024-01-13 NOTE — Discharge Instructions (Addendum)
 You are being treated for bacterial conjunctivitis to the left eye.  Use the antibiotic eyedrop and anti-inflammatory eyedrop as directed.  Keep the hands clean and dry when managing the eye medications.  Use sunglasses to help protect from light sensitivity.  Follow-up with [facility] or return to the ED immediately for worsening symptoms.

## 2024-01-13 NOTE — ED Notes (Addendum)
 See triage note  Presents with left eye pain  States he developed pain on Sunday  Denies any injury  States he noticed some swelling with some drainage
